# Patient Record
Sex: Female | Born: 2000 | Race: White | Hispanic: No | Marital: Single | Smoking: Never smoker
Health system: Southern US, Community
[De-identification: ages and names within clinical notes are randomized; demographics above are authoritative.]

## PROBLEM LIST (undated history)

## (undated) DIAGNOSIS — L509 Urticaria, unspecified: Secondary | ICD-10-CM

## (undated) HISTORY — PX: OTHER SURGICAL HISTORY: SHX169

## (undated) HISTORY — DX: Urticaria, unspecified: L50.9

---
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
## 2021-05-28 ENCOUNTER — Emergency Department (HOSPITAL_COMMUNITY): Payer: Medicaid Other

## 2021-05-28 ENCOUNTER — Encounter (HOSPITAL_COMMUNITY): Payer: Self-pay | Admitting: Emergency Medicine

## 2021-05-28 ENCOUNTER — Other Ambulatory Visit: Payer: Self-pay

## 2021-05-28 ENCOUNTER — Emergency Department (HOSPITAL_COMMUNITY)
Admission: EM | Admit: 2021-05-28 | Discharge: 2021-05-28 | Disposition: A | Discharge status: Home / Self Care | Payer: Medicaid Other | Attending: Emergency Medicine | Admitting: Emergency Medicine

## 2021-05-28 DIAGNOSIS — K2900 Acute gastritis without bleeding: Secondary | ICD-10-CM | POA: Diagnosis not present

## 2021-05-28 DIAGNOSIS — N9489 Other specified conditions associated with female genital organs and menstrual cycle: Secondary | ICD-10-CM | POA: Insufficient documentation

## 2021-05-28 DIAGNOSIS — R1013 Epigastric pain: Secondary | ICD-10-CM | POA: Diagnosis present

## 2021-05-28 LAB — URINALYSIS, ROUTINE W REFLEX MICROSCOPIC
Bilirubin Urine: NEGATIVE
Glucose, UA: NEGATIVE mg/dL
Hgb urine dipstick: NEGATIVE
Ketones, ur: NEGATIVE mg/dL
Leukocytes,Ua: NEGATIVE
Nitrite: NEGATIVE
Protein, ur: NEGATIVE mg/dL
Specific Gravity, Urine: 1.027 (ref 1.005–1.030)
pH: 5 (ref 5.0–8.0)

## 2021-05-28 LAB — COMPREHENSIVE METABOLIC PANEL
ALT: 13 U/L (ref 0–44)
AST: 18 U/L (ref 15–41)
Albumin: 3.9 g/dL (ref 3.5–5.0)
Alkaline Phosphatase: 55 U/L (ref 38–126)
Anion gap: 7 (ref 5–15)
BUN: 10 mg/dL (ref 6–20)
CO2: 22 mmol/L (ref 22–32)
Calcium: 9.1 mg/dL (ref 8.9–10.3)
Chloride: 108 mmol/L (ref 98–111)
Creatinine, Ser: 0.63 mg/dL (ref 0.44–1.00)
GFR, Estimated: >60 mL/min (ref >60)
Glucose, Bld: 95 mg/dL (ref 70–99)
Potassium: 3.6 mmol/L (ref 3.5–5.1)
Sodium: 137 mmol/L (ref 135–145)
Total Bilirubin: 1.1 mg/dL (ref 0.3–1.2)
Total Protein: 7.1 g/dL (ref 6.5–8.1)

## 2021-05-28 LAB — CBC
HCT: 42.6 % (ref 36.0–46.0)
Hemoglobin: 14.9 g/dL (ref 12.0–15.0)
MCH: 32.4 pg (ref 26.0–34.0)
MCHC: 35 g/dL (ref 30.0–36.0)
MCV: 92.6 fL (ref 80.0–100.0)
Platelets: 231 10*3/uL (ref 150–400)
RBC: 4.6 MIL/uL (ref 3.87–5.11)
RDW: 11.7 % (ref 11.5–15.5)
WBC: 5 10*3/uL (ref 4.0–10.5)
nRBC: 0 % (ref 0.0–0.2)

## 2021-05-28 LAB — LIPASE, BLOOD: Lipase: 29 U/L (ref 11–51)

## 2021-05-28 LAB — I-STAT BETA HCG BLOOD, ED (MC, WL, AP ONLY): I-stat hCG, quantitative: <5 m[IU]/mL (ref <5)

## 2021-05-28 MED ORDER — LACTATED RINGERS IV BOLUS: 1000.0000 mL | Freq: Once | INTRAVENOUS | Status: DC

## 2021-05-28 MED ORDER — PANTOPRAZOLE SODIUM 20 MG PO TBEC: 20.0000 mg | DELAYED_RELEASE_TABLET | Freq: Every day | ORAL | 0 refills | Status: DC

## 2021-05-28 NOTE — ED Notes (Signed)
Pt verbalizes understanding of discharge instructions. Opportunity for questions and answers were provided. Pt discharged from the ED.   ?

## 2021-05-28 NOTE — ED Triage Notes (Signed)
Pt reports lower abd burning and mid upper abd pain x 3 weeks that is worse after eating.  Also reports nausea.  Denies vomiting, diarrhea, and urinary complaints.

## 2021-05-28 NOTE — ED Provider Notes (Signed)
[facility] EMERGENCY DEPARTMENT Provider Note  CSN: [id number] Arrival date & time: 05/28/21 1456    History Chief Complaint  Patient presents with   Abdominal Pain    Natalie Richardson is a 20 y.o. female with no significant PMH reports about 3 weeks of intermittent burning bilateral abdominal and epigastric pain. Seems to be worse after eating, associated with nausea and poor PO intake. No diarrhea or fevers. No urinary symptoms.    History reviewed. No pertinent past medical history.  History reviewed. No pertinent surgical history.  No family history on file.  Social History   Tobacco Use   Smoking status: Never   Smokeless tobacco: Never  Substance Use Topics   Alcohol use: Not Currently   Drug use: Not Currently     Home Medications Prior to Admission medications   Medication Sig Start Date End Date Taking? Authorizing Provider  pantoprazole (PROTONIX) 20 MG tablet Take 1 tablet (20 mg total) by mouth daily. 05/28/21  Yes Pollyann Savoy, MD     Allergies    Patient has no allergy information on record.   Review of Systems   Review of Systems A comprehensive review of systems was completed and negative except as noted in HPI.    Physical Exam BP 98/74   Pulse 71   Temp 98.9 F (37.2 C) (Oral)   Resp 17   LMP 05/07/2021   SpO2 100%   Physical Exam Vitals and nursing note reviewed.  Constitutional:      Appearance: Normal appearance.  HENT:     Head: Normocephalic and atraumatic.     Nose: Nose normal.     Mouth/Throat:     Mouth: Mucous membranes are moist.  Eyes:     Extraocular Movements: Extraocular movements intact.     Conjunctiva/sclera: Conjunctivae normal.  Cardiovascular:     Rate and Rhythm: Normal rate.  Pulmonary:     Effort: Pulmonary effort is normal.     Breath sounds: Normal breath sounds.  Abdominal:     General: Abdomen is flat.     Palpations: Abdomen is soft.     Tenderness: There is abdominal  tenderness in the right upper quadrant and epigastric area. There is no guarding. Negative signs include Murphy's sign and McBurney's sign.  Musculoskeletal:        General: No swelling. Normal range of motion.     Cervical back: Neck supple.  Skin:    General: Skin is warm and dry.  Neurological:     General: No focal deficit present.     Mental Status: She is alert.  Psychiatric:        Mood and Affect: Mood normal.     ED Results / Procedures / Treatments   Labs (all labs ordered are listed, but only abnormal results are displayed) Labs Reviewed  LIPASE, BLOOD  COMPREHENSIVE METABOLIC PANEL  CBC  URINALYSIS, ROUTINE W REFLEX MICROSCOPIC  I-STAT BETA HCG BLOOD, ED ([facility], [facility], [facility] ONLY)    EKG None  Radiology US Abdomen Limited  Result Date: 05/28/2021 CLINICAL DATA:  Epigastric pain, right upper quadrant pain EXAM: ULTRASOUND ABDOMEN LIMITED RIGHT UPPER QUADRANT COMPARISON:  None. FINDINGS: Gallbladder: No gallstones or wall thickening visualized. No sonographic Murphy sign noted by sonographer. Common bile duct: Diameter: Normal caliber, 3 mm. Liver: No focal lesion identified. Within normal limits in parenchymal echogenicity. Portal vein is patent on color Doppler imaging with normal direction of blood flow towards the liver. Other: None. IMPRESSION:  Normal study. Electronically Signed   By: Charlett Nose M.D.   On: 05/28/2021 16:16    Procedures Procedures  Medications Ordered in the ED Medications  lactated ringers bolus 1,000 mL (has no administration in time range)     MDM Rules/Calculators/A&P MDM Patient with symptoms most consistent with biliary colic. Will check labs, [place]. Patient's pain is minimal at this time. She feels dehydrated, will give IVF as well.   ED Course  I have reviewed the triage vital signs and the nursing notes.  Pertinent labs & imaging results that were available during my care of the patient were reviewed by me and considered in my medical  decision making (see chart for details).  Clinical Course as of 05/28/21 1812  Sun May 28, 2021  1556 CBC, CMP, Lipase and UA are all normal. Awaiting [place].  [CS]  1810 [place] is neg for acute process. Patient is able to drink fluids without difficulty. Consider GERD/gastritis as a cause of her symptoms. Will start on Protonix refer to GI if not improving.  [CS]    Clinical Course User Index [CS] Pollyann Savoy, MD    Final Clinical Impression(s) / ED Diagnoses Final diagnoses:  Acute gastritis without hemorrhage, unspecified gastritis type    Rx / DC Orders ED Discharge Orders          Ordered    pantoprazole (PROTONIX) 20 MG tablet  Daily        05/28/21 1812             Pollyann Savoy, MD 05/28/21 1812

## 2023-03-28 ENCOUNTER — Ambulatory Visit: Payer: Medicaid Other | Admitting: Allergy and Immunology

## 2023-03-28 ENCOUNTER — Encounter: Payer: Self-pay | Admitting: Allergy and Immunology

## 2023-03-28 VITALS — BP 108/68 | HR 68 | Resp 16 | Ht 64.5 in | Wt 142.4 lb

## 2023-03-28 DIAGNOSIS — T7840XD Allergy, unspecified, subsequent encounter: Secondary | ICD-10-CM | POA: Diagnosis not present

## 2023-03-28 DIAGNOSIS — L700 Acne vulgaris: Secondary | ICD-10-CM

## 2023-03-28 DIAGNOSIS — T7840XA Allergy, unspecified, initial encounter: Secondary | ICD-10-CM

## 2023-03-28 DIAGNOSIS — L503 Dermatographic urticaria: Secondary | ICD-10-CM

## 2023-03-28 DIAGNOSIS — L500 Allergic urticaria: Secondary | ICD-10-CM

## 2023-03-28 MED ORDER — ADAPALENE 0.1 % EX CREA: TOPICAL_CREAM | CUTANEOUS | 3 refills | Status: AC

## 2023-03-28 NOTE — Progress Notes (Signed)
[place] - [place] - [place] - [place] - [place]   NEW PATIENT NOTE  Referring Provider: No ref. provider found Primary Provider: Jerrye Bushy, FNP Date of office visit: 03/28/2023    Subjective:   Chief Complaint:  Natalie Richardson (DOB: 2000-07-22) is a 22 y.o. female who presents to the clinic on 03/28/2023 with a chief complaint of Urticaria .     HPI: Fallen presents to this clinic in evaluation of hives.  She has noticed over the course of the past 3 months that she has developed intense itching of her body and when she scratch her body she develops giant hives.  She has no associated systemic or constitutional symptoms with these outbreaks.  They do occur on a daily basis.  When her hives resolve they never leave behind a scar or hyper pigmentation.  There is not really an obvious provoking factor giving rise to this activity.  She has not really had a significant change in her environment, started any new supplements, started any new medications, or had symptoms of an ongoing infectious disease.  Her last menstrual period was this weekend.  She has used [employer] [facility] on a daily basis which has helped somewhat.  She required emergency room evaluation 2 months ago and they gave her prednisone which really did not help.  Apparently she has had some blood tests at [facility].  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   Linzess 72 MCG capsule Generic drug: linaclotide Take 72 mcg by mouth every morning.    Review of systems negative except as noted in HPI / PMHx or noted below:  Review of Systems   Constitutional: Negative.   HENT: Negative.    Eyes: Negative.   Respiratory: Negative.    Cardiovascular: Negative.   Gastrointestinal: Negative.   Genitourinary: Negative.   Musculoskeletal: Negative.   Skin: Negative.   Neurological: Negative.   Endo/Heme/Allergies: Negative.   Psychiatric/Behavioral: Negative.      History reviewed. No pertinent family history.  Social History   Socioeconomic History   Marital status: Single    Spouse name: Not on file   Number of children: Not on file   Years of education: Not on file   Highest education level: Not on file  Occupational History   Not on file  Tobacco Use   Smoking status: Never   Smokeless tobacco: Never  Substance and Sexual Activity   Alcohol use: Yes   Drug use: Not Currently   Sexual activity: Not on file  Other Topics Concern   Not on file  Social History Narrative   Not on file   Social Determinants of Health   Financial Resource Strain: Low Risk  (10/21/2022)   Received from [facility]   Overall Financial Resource Strain (CARDIA)    Difficulty of Paying Living Expenses: Not very hard  Food Insecurity: Food Insecurity Present (10/21/2022)   Received from [facility]   Hunger Vital Sign    Worried About Running Out of Food in the Last Year: Sometimes  true    Ran Out of Food in the Last Year: Sometimes true  Transportation Needs: Unmet Transportation Needs (10/21/2022)   Received from [facility] - Transportation    Lack of Transportation (Medical): Yes    Lack of Transportation (Non-Medical): Yes  Physical Activity: Sufficiently Active (10/21/2022)   Received from [facility]   Exercise Vital Sign    Days of Exercise per Week: 5 days    Minutes of Exercise per Session: 90 min  Stress: No Stress Concern Present (10/21/2022)   Received from [facility] - Occupational Stress Questionnaire    Feeling of Stress : Only a little  Social  Connections: Moderately Integrated (10/21/2022)   Received from [facility]   Social Network    How would you rate your social network (family, work, friends)?: Adequate participation with social networks  Intimate Partner Violence: Not At Risk (10/21/2022)   Received from [facility]   HITS    Over the last 12 months how often did your partner physically hurt you?: 1    Over the last 12 months how often did your partner insult you or talk down to you?: 1    Over the last 12 months how often did your partner threaten you with physical harm?: 1    Over the last 12 months how often did your partner scream or curse at you?: 1    Environmental and Social history  Lives in a condominium with a dry environment, no animals located inside the household, carpet in the bedroom, plastic on the bed, plastic on the pillow, and no smoking ongoing with inside the household.  She is a esthetician  Objective:   Vitals:   03/28/23 1431  BP: 108/68  Pulse: 68  Resp: 16  SpO2: 98%   Height: 5' 4.5" (163.8 cm) Weight: 142 lb 6.4 oz (64.6 kg)  Physical Exam Constitutional:      Appearance: She is not diaphoretic.  HENT:     Head: Normocephalic.     Right Ear: Tympanic membrane, ear canal and external ear normal.     Left Ear: Tympanic membrane, ear canal and external ear normal.     Nose: Nose normal. No mucosal edema or rhinorrhea.     Mouth/Throat:     Pharynx: Uvula midline. No oropharyngeal exudate.  Eyes:     Conjunctiva/sclera: Conjunctivae normal.  Neck:     Thyroid: No thyromegaly.     Trachea: Trachea normal. No tracheal tenderness or tracheal deviation.  Cardiovascular:     Rate and Rhythm: Normal rate and regular rhythm.     Heart sounds: Normal heart sounds, S1 normal and S2 normal. No murmur heard. Pulmonary:     Effort: No respiratory distress.     Breath sounds: Normal breath sounds. No stridor. No wheezing or rales.  Lymphadenopathy:     Head:     Right side of head: No  tonsillar adenopathy.     Left side of head: No tonsillar adenopathy.     Cervical: No cervical adenopathy.  Skin:    Findings: Rash (facial acne) present. No erythema.     Nails: There is no clubbing.  Neurological:     Mental Status: She is alert.     Diagnostics: Allergy skin tests were performed.  She demonstrated hypersensitivity against dust mite, dog, tree, grass, weed.  Assessment and Plan:    1. Allergic urticaria   2. Dermatographia   3.  Hypersensitivity disorder, initial encounter   4. Acne vulgaris    1. Allergen avoidance measures - dust mite, dog, tree, grass, weed.  2. Fexofenadine 180 - 1 tablet 1-2 times per day  3. Blood - alpha-gal panel, TSH, FT4, thyroid peroxidase  4. Treat acne with Differin 0.1% cream applied to face nightly  5.  Return to clinic in 6 weeks or earlier if problem  6. 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  All of her hyperactivity may be based upon her atopic phenotype and we we will have her perform allergen avoidance measures as best as possible and she can take an antihistamine 1 or 2 times per day on a consistent basis.  She also has acne and I have given her some Differin to use and we will see her back in this clinic in 6 weeks to assess her response to this approach.   Jessica Priest, MD Allergy / Immunology [facility]

## 2023-03-28 NOTE — Patient Instructions (Addendum)
  1. Allergen avoidance measures - dust mite, dog, tree, grass, weed.  2. Fexofenadine 180 - 1 tablet 1-2 times per day  3. Blood - alpha-gal panel, TSH, FT4, thyroid peroxidase  4. Treat acne with Differin 0.1% cream applied to face nightly  5.  Return to clinic in 6 weeks or earlier if problem  6. Plan for fall flu vaccine

## 2023-04-01 ENCOUNTER — Encounter: Payer: Self-pay | Admitting: Allergy and Immunology

## 2023-04-03 LAB — THYROID PEROXIDASE ANTIBODY

## 2023-04-03 LAB — ALPHA-GAL PANEL
Allergen Lamb IgE: <0.1 kU/L
Beef IgE: <0.1 kU/L
IgE (Immunoglobulin E), Serum: 23 [IU]/mL (ref 6–495)
O215-IgE Alpha-Gal: <0.1 kU/L
Pork IgE: <0.1 kU/L

## 2023-04-03 LAB — TSH+FREE T4

## 2023-04-29 ENCOUNTER — Encounter: Payer: Self-pay | Admitting: *Deleted

## 2023-05-09 ENCOUNTER — Ambulatory Visit: Payer: Medicaid Other | Admitting: Allergy and Immunology

## 2023-05-20 ENCOUNTER — Ambulatory Visit: Payer: Medicaid Other | Admitting: Allergy and Immunology

## 2023-06-08 IMAGING — US US ABDOMEN LIMITED
1 series · 14 of 25 positions shown · non-contrast
Comparison: None.

CLINICAL DATA: Epigastric pain, right upper quadrant pain

EXAM:
ULTRASOUND ABDOMEN LIMITED RIGHT UPPER QUADRANT

[Series 1: us abdomen limited · 14 of 42 slices shown]
[im 1/42]
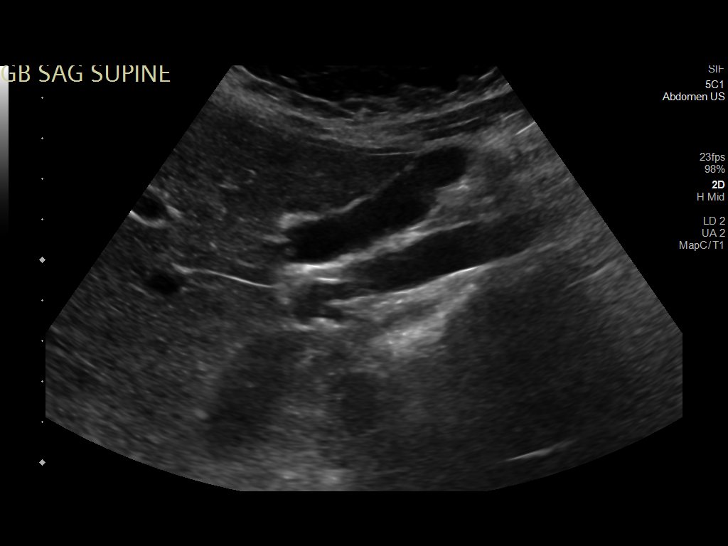
[im 4/42]
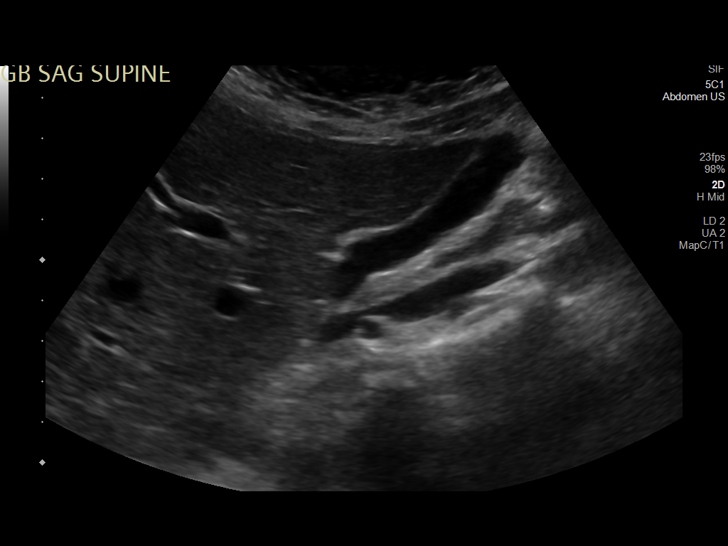
[im 7/42]
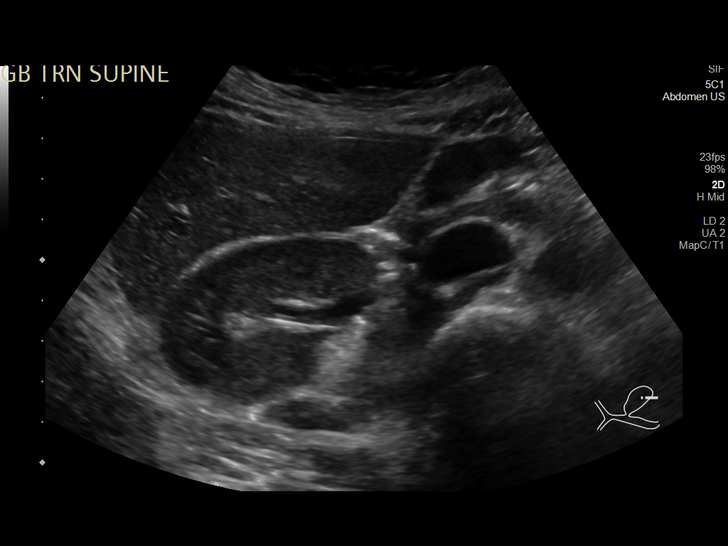
[im 11/42]
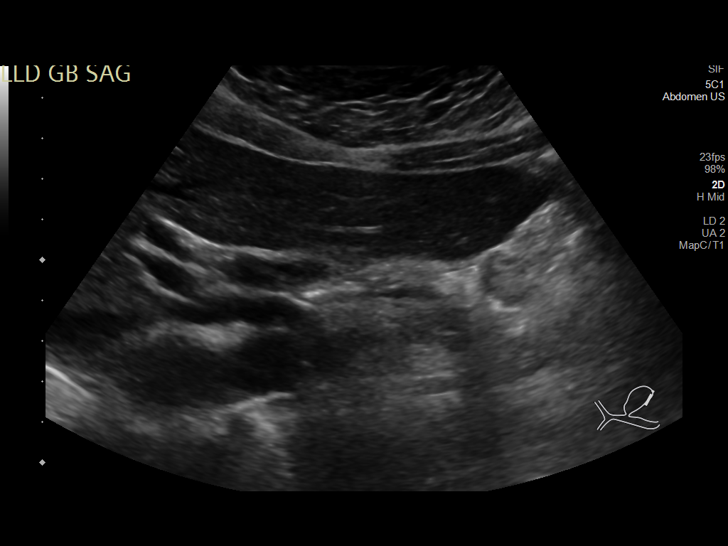
[im 14/42]
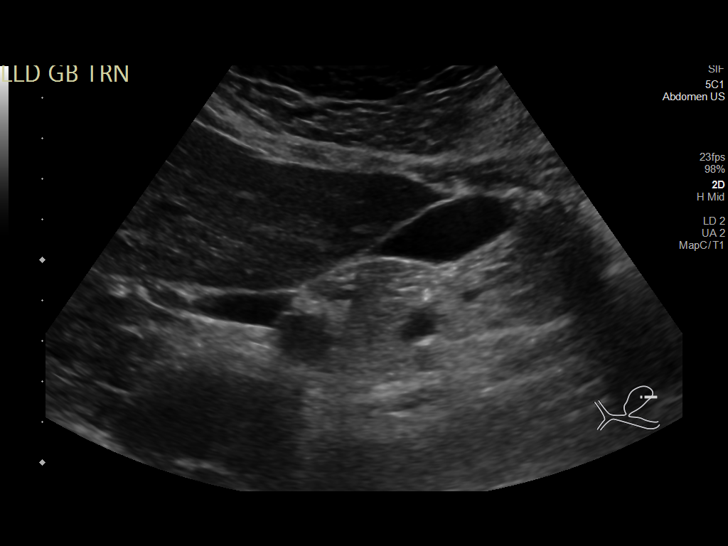
[im 16/42]
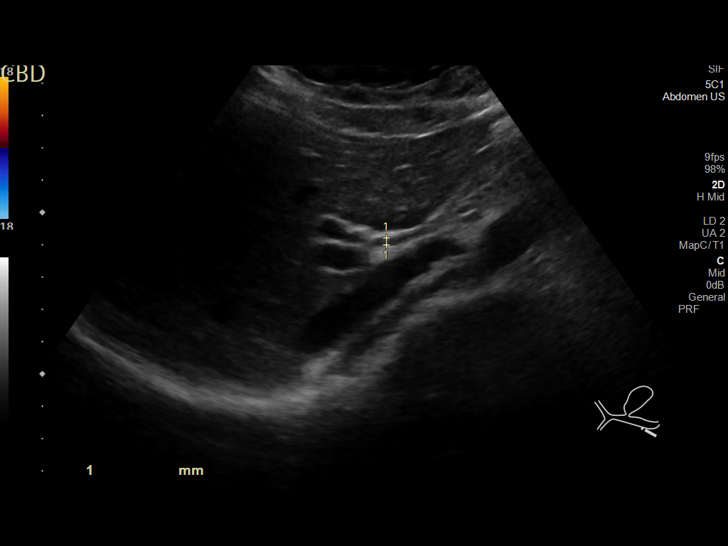
[im 19/42]
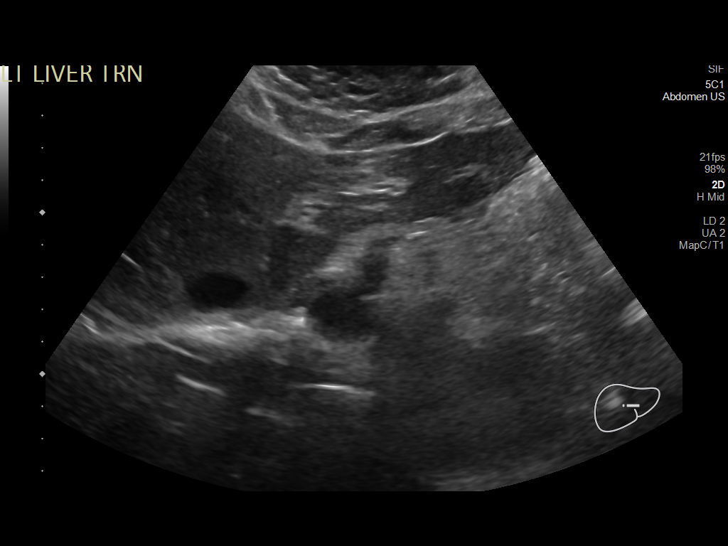
[im 23/42]
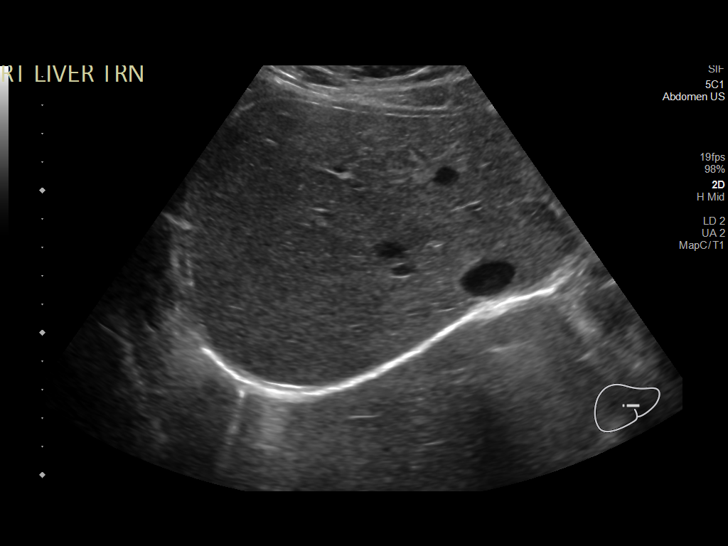
[im 26/42]
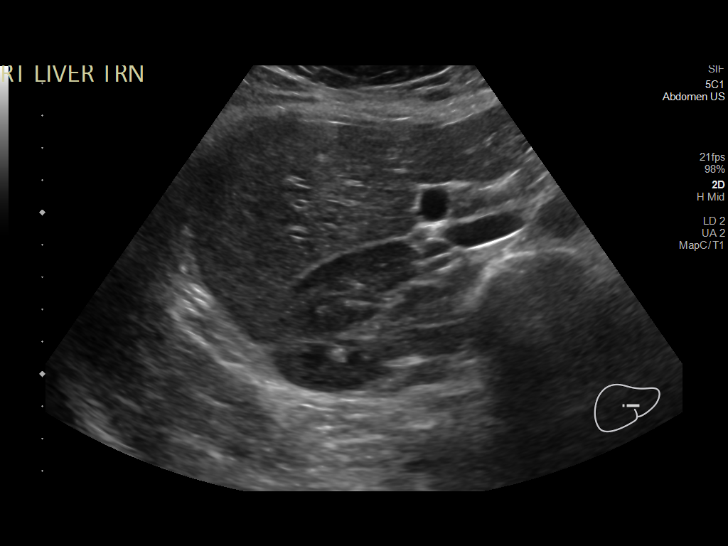
[im 28/42]
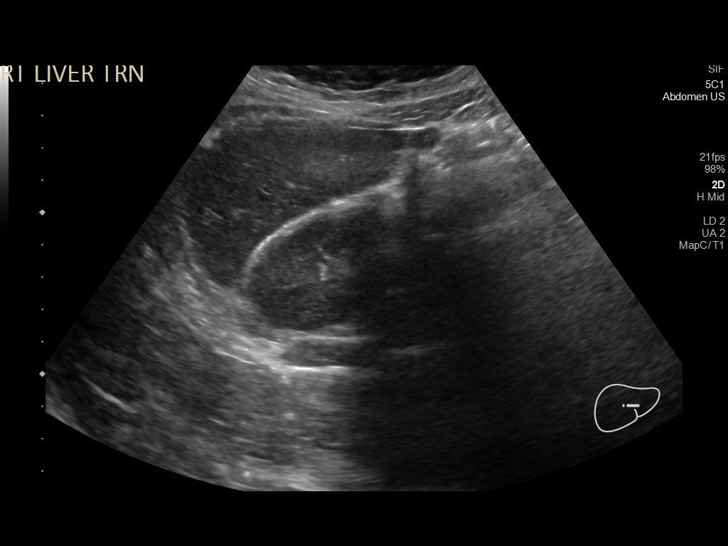
[im 31/42]
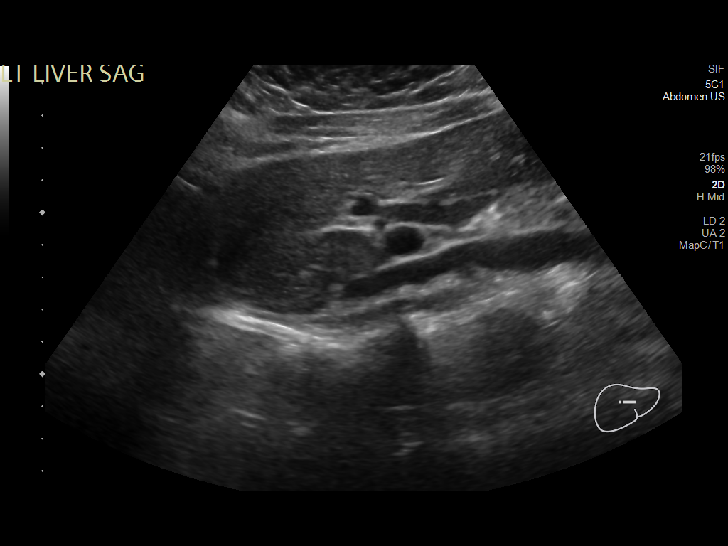
[im 35/42]
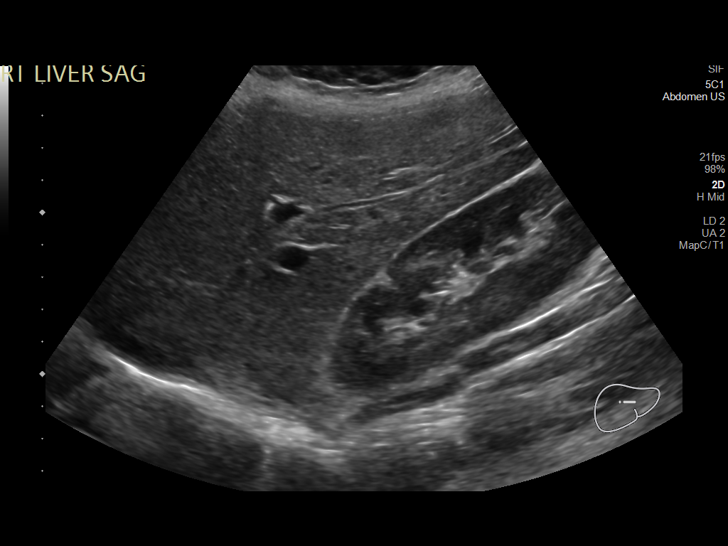
[im 38/42]
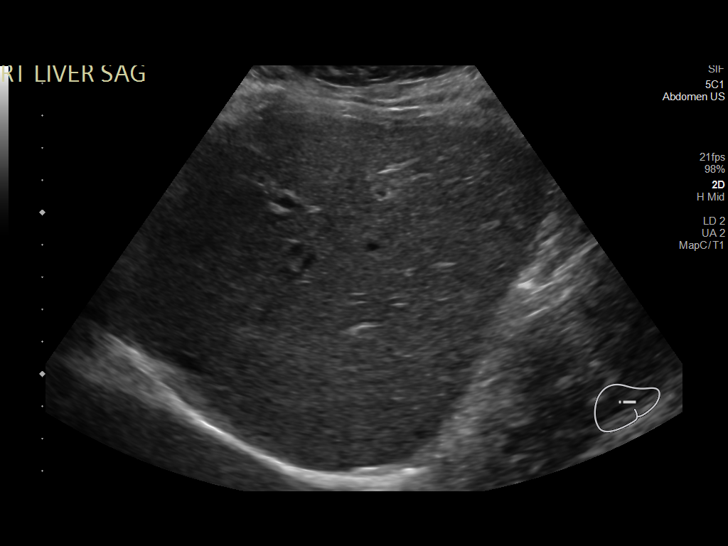
[im 42/42]
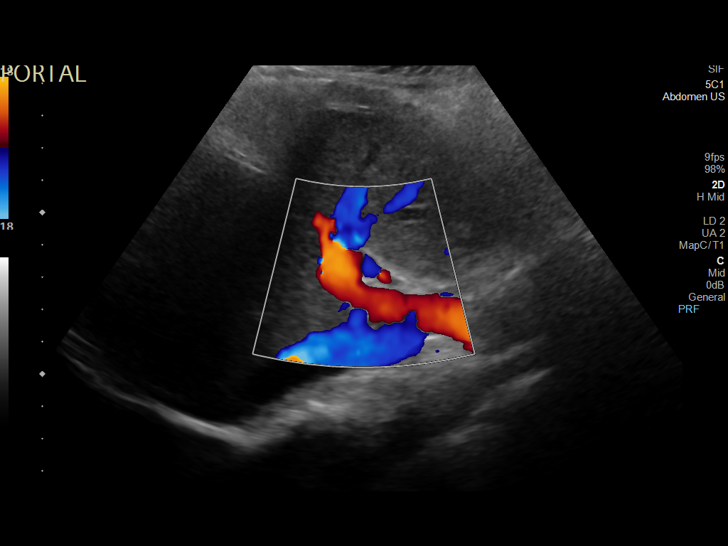

[14 of 25 positions shown; findings below may reference images not displayed]

FINDINGS: Gallbladder:

No gallstones or wall thickening visualized. No sonographic Murphy
sign noted by sonographer.

Common bile duct:

Diameter: Normal caliber, 3 mm.

Liver:

No focal lesion identified. Within normal limits in parenchymal
echogenicity. Portal vein is patent on color Doppler imaging with
normal direction of blood flow towards the liver.

Other: None.
IMPRESSION: Normal study.
